# Patient Record
Sex: Female | Born: 1990 | Race: White | Hispanic: No | Marital: Single | Smoking: Never smoker
Health system: Southern US, Community
[De-identification: ages and names within clinical notes are randomized; demographics above are authoritative.]

## PROBLEM LIST (undated history)

## (undated) DIAGNOSIS — N200 Calculus of kidney: Secondary | ICD-10-CM

## (undated) DIAGNOSIS — N83209 Unspecified ovarian cyst, unspecified side: Secondary | ICD-10-CM

---
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
## 2013-11-13 ENCOUNTER — Emergency Department (HOSPITAL_COMMUNITY): Payer: 59

## 2013-11-13 ENCOUNTER — Encounter (HOSPITAL_COMMUNITY): Payer: Self-pay | Admitting: Emergency Medicine

## 2013-11-13 ENCOUNTER — Emergency Department (HOSPITAL_COMMUNITY)
Admission: EM | Admit: 2013-11-13 | Discharge: 2013-11-13 | Disposition: A | Discharge status: Home / Self Care | Payer: 59 | Attending: Emergency Medicine | Admitting: Emergency Medicine

## 2013-11-13 DIAGNOSIS — R109 Unspecified abdominal pain: Secondary | ICD-10-CM | POA: Insufficient documentation

## 2013-11-13 DIAGNOSIS — R000 Tachycardia, unspecified: Secondary | ICD-10-CM | POA: Diagnosis not present

## 2013-11-13 DIAGNOSIS — Z3202 Encounter for pregnancy test, result negative: Secondary | ICD-10-CM | POA: Insufficient documentation

## 2013-11-13 DIAGNOSIS — N200 Calculus of kidney: Secondary | ICD-10-CM | POA: Insufficient documentation

## 2013-11-13 DIAGNOSIS — N838 Other noninflammatory disorders of ovary, fallopian tube and broad ligament: Secondary | ICD-10-CM | POA: Diagnosis not present

## 2013-11-13 DIAGNOSIS — N83209 Unspecified ovarian cyst, unspecified side: Secondary | ICD-10-CM

## 2013-11-13 HISTORY — DX: Calculus of kidney: N20.0

## 2013-11-13 HISTORY — DX: Unspecified ovarian cyst, unspecified side: N83.209

## 2013-11-13 LAB — URINALYSIS, ROUTINE W REFLEX MICROSCOPIC
BILIRUBIN URINE: NEGATIVE
GLUCOSE, UA: NEGATIVE mg/dL
Ketones, ur: NEGATIVE mg/dL
Leukocytes, UA: NEGATIVE
Nitrite: NEGATIVE
PH: 8 (ref 5.0–8.0)
Protein, ur: NEGATIVE mg/dL
SPECIFIC GRAVITY, URINE: 1.021 (ref 1.005–1.030)
UROBILINOGEN UA: 0.2 mg/dL (ref 0.0–1.0)

## 2013-11-13 LAB — I-STAT CHEM 8, ED
BUN: 12 mg/dL (ref 6–23)
CHLORIDE: 104 meq/L (ref 96–112)
CREATININE: 0.7 mg/dL (ref 0.50–1.10)
Calcium, Ion: 1.17 mmol/L (ref 1.12–1.23)
GLUCOSE: 101 mg/dL — AB (ref 70–99)
HCT: 40 % (ref 36.0–46.0)
Hemoglobin: 13.6 g/dL (ref 12.0–15.0)
POTASSIUM: 4.6 meq/L (ref 3.7–5.3)
SODIUM: 138 meq/L (ref 137–147)
TCO2: 25 mmol/L (ref 0–100)

## 2013-11-13 LAB — URINE MICROSCOPIC-ADD ON

## 2013-11-13 LAB — POC URINE PREG, ED: Preg Test, Ur: NEGATIVE

## 2013-11-13 MED ORDER — KETOROLAC TROMETHAMINE 30 MG/ML IJ SOLN
30.0000 mg | Freq: Once | INTRAMUSCULAR | Status: AC
  Administered 2013-11-13: 30 mg via INTRAVENOUS
  Filled 2013-11-13: qty 1

## 2013-11-13 MED ORDER — ONDANSETRON 4 MG PO TBDP
8.0000 mg | ORAL_TABLET | Freq: Once | ORAL | Status: AC
  Administered 2013-11-13: 8 mg via ORAL
  Filled 2013-11-13: qty 2

## 2013-11-13 MED ORDER — MORPHINE SULFATE 4 MG/ML IJ SOLN
4.0000 mg | Freq: Once | INTRAMUSCULAR | Status: AC
  Administered 2013-11-13: 4 mg via INTRAVENOUS
  Filled 2013-11-13: qty 1

## 2013-11-13 MED ORDER — TAMSULOSIN HCL 0.4 MG PO CAPS: 0.4000 mg | ORAL_CAPSULE | Freq: Every day | ORAL | Status: AC

## 2013-11-13 MED ORDER — OXYCODONE-ACETAMINOPHEN 5-325 MG PO TABS
2.0000 | ORAL_TABLET | ORAL | Status: AC | PRN
Start: 2013-11-13 — End: ?

## 2013-11-13 MED ORDER — SODIUM CHLORIDE 0.9 % IV BOLUS (SEPSIS)
1000.0000 mL | Freq: Once | INTRAVENOUS | Status: AC
  Administered 2013-11-13: 1000 mL via INTRAVENOUS

## 2013-11-13 MED ORDER — ONDANSETRON HCL 4 MG/2ML IJ SOLN
4.0000 mg | Freq: Once | INTRAMUSCULAR | Status: AC
  Administered 2013-11-13: 4 mg via INTRAVENOUS
  Filled 2013-11-13: qty 2

## 2013-11-13 MED ORDER — ONDANSETRON HCL 4 MG PO TABS: 4.0000 mg | ORAL_TABLET | Freq: Four times a day (QID) | ORAL | Status: AC

## 2013-11-13 MED ORDER — OXYCODONE-ACETAMINOPHEN 5-325 MG PO TABS
1.0000 | ORAL_TABLET | Freq: Once | ORAL | Status: AC
  Administered 2013-11-13: 1 via ORAL
  Filled 2013-11-13: qty 1

## 2013-11-13 NOTE — ED Notes (Addendum)
Pt with emesis x 1.  PA stated to give zofran and fluid challenge and watch x 30 min.

## 2013-11-13 NOTE — ED Provider Notes (Signed)
CSN: [id number]     Arrival date & time 11/13/13  0911 History   First MD Initiated Contact with Patient 11/13/13 0945     Chief Complaint  Patient presents with  . Flank Pain     (Consider location/radiation/quality/duration/timing/severity/associated sxs/prior Treatment) HPI  23 year old female with history of ovarian cyst and history of kidney stone presents complaining of left flank pain. Patient reports gradual onset of pain to her left flank that started this morning. Pain is described as a sharp sensation, intense, and colicky. Pain is nonradiating and felt similar to kidney stones in the past. She felt nauseous but without any vomiting or diarrhea. Pain eventually subsided after taking several Advil. Currently her pain is 6/10. She reported mild urinary discomfort. There is no associated fever, chills, headache, chest pain, shortness of breath, abdominal pain, hematuria, vaginal discharge, hematochezia or melena. She was last diagnosed with kidney stones 2 years ago. She reports that the previous CT scan did demonstrate several nonobstructive kidney stones in both kidneys according to her doctor.    Past Medical History  Diagnosis Date  . Kidney stones   . Ovarian cyst    History reviewed. No pertinent past surgical history. No family history on file. History  Substance Use Topics  . Smoking status: Never Smoker   . Smokeless tobacco: Not on file  . Alcohol Use: No   OB History   Grav Para Term Preterm Abortions TAB SAB Ect Mult Living                 Review of Systems  All other systems reviewed and are negative.     Allergies  Review of patient's allergies indicates no known allergies.  Home Medications   Prior to Admission medications   Medication Sig Start Date End Date Taking? Authorizing Provider  ibuprofen (ADVIL,MOTRIN) 600 MG tablet Take 1 tablet (600 mg total) by mouth every 6 (six) hours as needed for pain. 12/31/11   Nelia Shi, MD   oxyCODONE-acetaminophen (PERCOCET) 7.5-325 MG per tablet Take 1 tablet by mouth every 4 (four) hours as needed for pain. 12/31/11   Nelia Shi, MD  promethazine (PHENERGAN) 25 MG tablet Take 1 tablet (25 mg total) by mouth every 6 (six) hours as needed for nausea. 12/31/11   Nelia Shi, MD   BP 141/86  Pulse 115  Temp(Src) 97.8 F (36.6 C) (Oral)  Resp 20  Ht  (1.651 m)  Wt 125 lb (56.7 kg)  BMI 20.80 kg/m2  SpO2 100%  LMP 10/13/2013 Physical Exam  Nursing note and vitals reviewed. Constitutional: She appears well-developed and well-nourished. No distress.  HENT:  Head: Atraumatic.  Eyes: Conjunctivae are normal.  Neck: Neck supple.  Cardiovascular:  Tachycardia without murmur rubs or gallops  Pulmonary/Chest: Effort normal and breath sounds normal.  Abdominal: Soft. She exhibits no distension. There is no tenderness.  Genitourinary:  Left CVA tenderness on percussion  Neurological: She is alert.  Skin: No rash noted.  Psychiatric: She has a normal mood and affect.    ED Course  Procedures (including critical care time)  9:58 AM Patient presents with left flank pain suggestive of kidney stones. She also has history of ovarian cyst in the past however her abdominal exam is unremarkable at this time. Workup initiated. Patient is tachycardic, likely secondary to pain. Pain medication given  1:12 PM UA without evidence of urinary tract infection, renal function is normal, pregnancy test is negative, abdominal and pelvis  CT scan demonstrated a 3 mm callus to the distal left ureter causing mild to moderate hydronephrosis. The intrarenal calculi bilaterally. There are also fluid tracking from the left ovary to the cul-de-sac probably is indicated of recent ovarian cyst rupture. At this time patient's pain has improved from 10 out of 10-7/10. We'll continue with pain management. Patient is able to tolerates by mouth. Plan to have patient followup with urologist for  further evaluation. Patient will be discharged with urine strainer, pain medication, and Flomax. Patient voiced understanding and agrees with plan.   Labs Review Labs Reviewed  URINALYSIS, ROUTINE W REFLEX MICROSCOPIC - Abnormal; Notable for the following:    APPearance CLOUDY (*)    Hgb urine dipstick TRACE (*)    All other components within normal limits  I-STAT CHEM 8, ED - Abnormal; Notable for the following:    Glucose, Bld 101 (*)    All other components within normal limits  URINE MICROSCOPIC-ADD ON  POC URINE PREG, ED    Imaging Review Ct Renal Stone Study  11/13/2013   CLINICAL DATA:  Flank pain  EXAM: CT ABDOMEN AND PELVIS WITHOUT CONTRAST  TECHNIQUE: Multidetector CT imaging of the abdomen and pelvis was performed following the standard protocol without oral or intravenous contrast material administration.  COMPARISON:  December 31, 2011  FINDINGS: Lung bases are clear.  The liver is prominent measuring 16.9 cm in length. No focal liver lesions are identified on this noncontrast enhanced study. Gallbladder wall is not thickened. There is no appreciable biliary duct dilatation.  Spleen, pancreas, and adrenals appear normal.  In the right kidney, there is a 3 mm calculus in the mid kidney region laterally. There is a 2 mm calculus in the mid kidney anteriorly with a 1 mm calculus slightly more posteriorly in the mid kidney. There are several tiny calculi in the upper pole region. There is no right renal mass or hydronephrosis. There is no right ureteral calculus apparent.  In the left kidney, there is a 3 mm calculus anteriorly in the midportion. More posteriorly on the left, there is a tiny calculus in the mid kidney. There is mild to moderate hydronephrosis on the left. There is no renal mass on the left. On axial slice 73, there is a 3 mm calculus in the distal left ureter. No other ureteral calculi are identified.  In the pelvis, the urinary bladder is midline with normal wall  thickness. There is fluid tracking from the left ovary to the cul-de-sac. There is no pelvic mass appreciable. There is a follicle in the right ovary measuring 2.2 x 2.0 cm.  Appendix appears normal.  There is no bowel obstruction.  No free air or portal venous air.  There is no adenopathy or abscess in the abdomen or pelvis. There is no demonstrable abdominal aortic aneurysm. There are no blastic or lytic bone lesions.  IMPRESSION: There is a 3 mm calculus in the distal left ureter causing mild to moderate hydronephrosis on the left. There are intrarenal calculi bilaterally.  Fluid tracking from the left ovary to the cul-de-sac probably is indicative of recent ovarian cyst rupture.  No abscess. No bowel obstruction. The appendix region appears normal.  Liver is prominent but otherwise unremarkable in appearance.   Electronically Signed   By: Bretta Bang M.D.   On: 11/13/2013 12:45     EKG Interpretation None      MDM   Final diagnoses:  Kidney stone on left side  Ruptured ovarian cyst  BP 132/79  Pulse 72  Temp(Src) 97.8 F (36.6 C) (Oral)  Resp 21  Ht  (1.651 m)  Wt 125 lb (56.7 kg)  BMI 20.80 kg/m2  SpO2 100%  LMP 10/13/2013  I have reviewed nursing notes and vital signs. I personally reviewed the imaging tests through PACS system  I reviewed available ER/hospitalization records thought the EMR     Fayrene Helper, [place] 11/13/13 1352

## 2013-11-13 NOTE — ED Notes (Signed)
Pt c/o left flank pain onset this morning pt has history of kidney stones and ruptured ovarian cyst in the past. Pt reports nausea.

## 2013-11-13 NOTE — ED Notes (Signed)
PA notified of pt pain.  PA at bedside updating pt.

## 2013-11-13 NOTE — ED Notes (Signed)
PT tolerated po without emesis

## 2013-11-13 NOTE — ED Provider Notes (Signed)
Medical screening examination/treatment/procedure(s) were performed by non-physician practitioner and as supervising physician I was immediately available for consultation/collaboration.   EKG Interpretation None        Jisell Majer J. Phylis Javed, MD 11/13/13 1353 

## 2013-11-13 NOTE — Discharge Instructions (Signed)
You have a 3mm stone on the left kidney at the distal ureter.  You will likely pass this stone within 3 days.  Use urine strainer to capture the stone and bring it to the urologist for further evaluation.  Take pain medication as needed.  Return if your symptoms worsen or if you have other concerns.  Kidney Stones Kidney stones (urolithiasis) are solid masses that form inside your kidneys. The intense pain is caused by the stone moving through the kidney, ureter, bladder, and urethra (urinary tract). When the stone moves, the ureter starts to spasm around the stone. The stone is usually passed in your pee (urine).  HOME CARE  Drink enough fluids to keep your pee clear or pale yellow. This helps to get the stone out.  Strain all pee through the provided strainer. Do not pee without peeing through the strainer, not even once. If you pee the stone out, catch it in the strainer. The stone may be as small as a grain of salt. Take this to your doctor. This will help your doctor figure out what you can do to try to prevent more kidney stones.  Only take medicine as told by your doctor.  Follow up with your doctor as told.  Get follow-up X-rays as told by your doctor. GET HELP IF: You have pain that gets worse even if you have been taking pain medicine. GET HELP RIGHT AWAY IF:   Your pain does not get better with medicine.  You have a fever or shaking chills.  Your pain increases and gets worse over 18 hours.  You have new belly (abdominal) pain.  You feel faint or pass out.  You are unable to pee. MAKE SURE YOU:   Understand these instructions.  Will watch your condition.  Will get help right away if you are not doing well or get worse. Document Released: 08/01/2007 Document Revised: 10/15/2012 Document Reviewed: 07/16/2012 [facility] Patient Information 2015 [place], [place]. This information is not intended to replace advice given to you by your health care provider. Make sure you  discuss any questions you have with your health care provider.

## 2016-05-19 IMAGING — CT CT RENAL STONE PROTOCOL
2 of 4 series · 16 of 46 positions shown, 18 images · non-contrast
Comparison: December 31, 2011

CLINICAL DATA: Flank pain

EXAM:
CT ABDOMEN AND PELVIS WITHOUT CONTRAST
TECHNIQUE: Multidetector CT imaging of the abdomen and pelvis was performed
following the standard protocol without oral or intravenous contrast
material administration.

[Series 2: stone study 5.0 i30f 1 · axial · 0.65mm/px · z∈[-471,-66]mm · 13 of 89 slices shown, 15 images]
[im 4/89  soft-tissue]
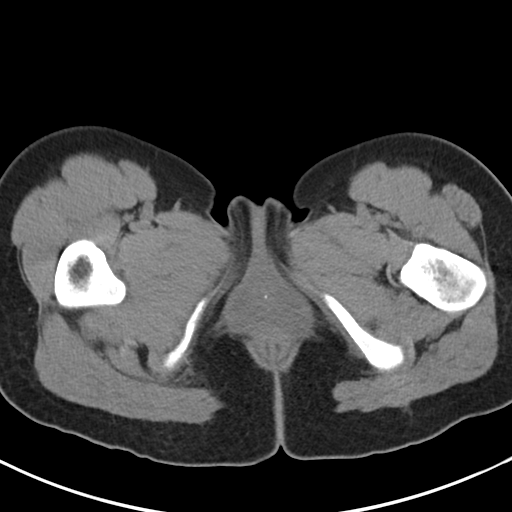
[im 4/89  bone]
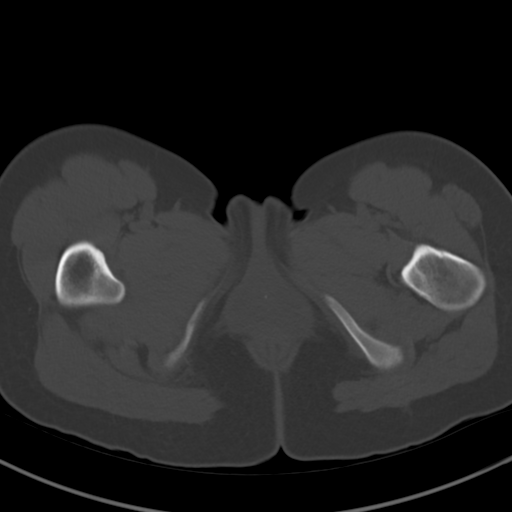
[im 11/89  soft-tissue]
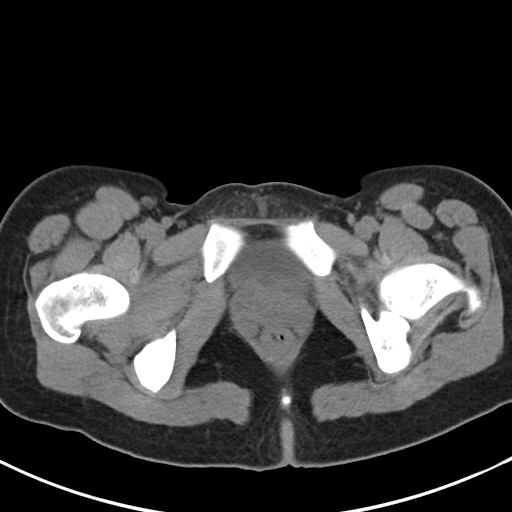
[im 18/89  soft-tissue]
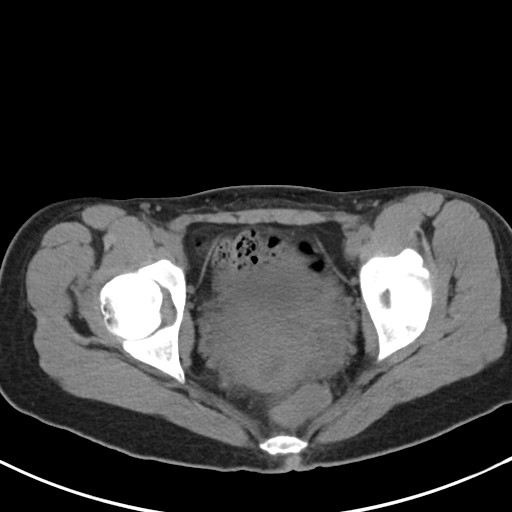
[im 25/89  soft-tissue]
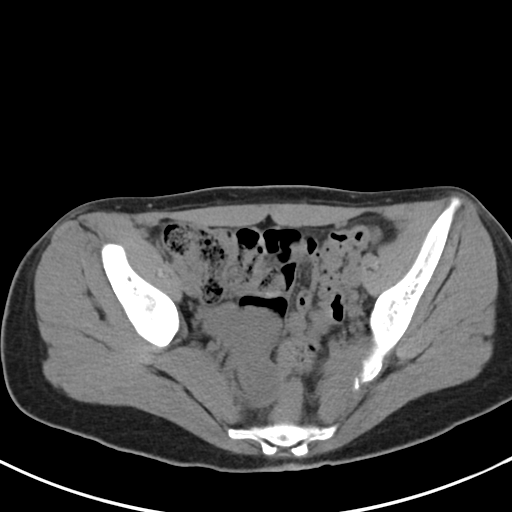
[im 32/89  soft-tissue]
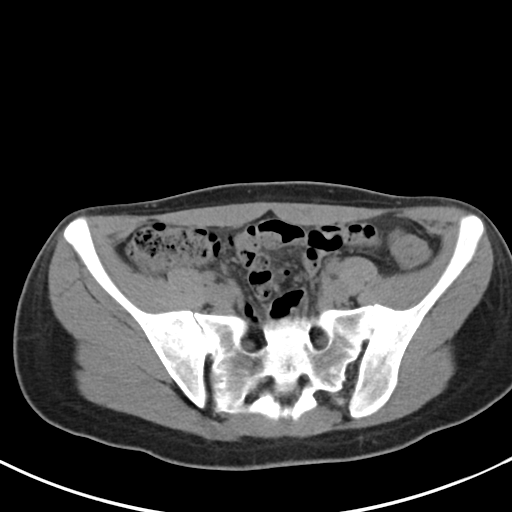
[im 39/89  soft-tissue]
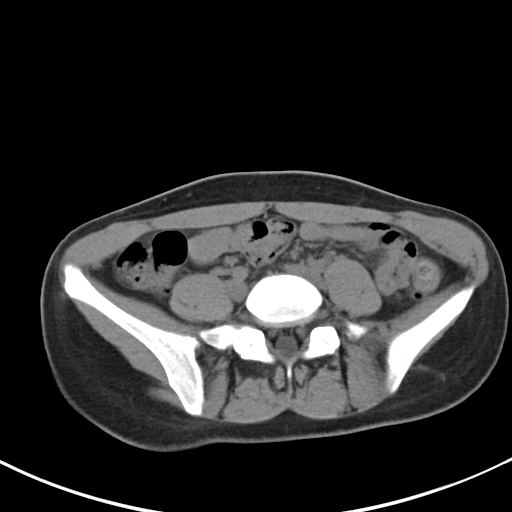
[im 46/89  soft-tissue]
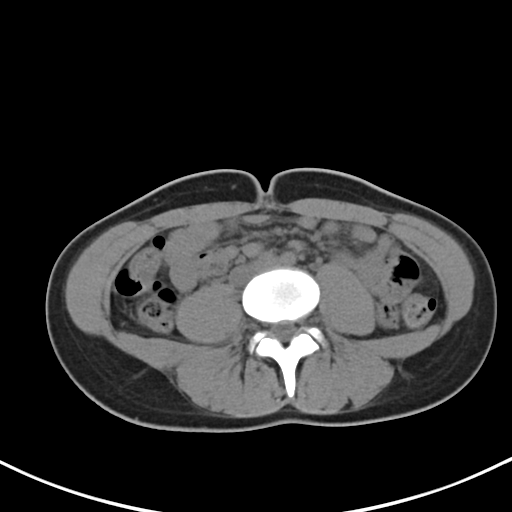
[im 50/89  soft-tissue]
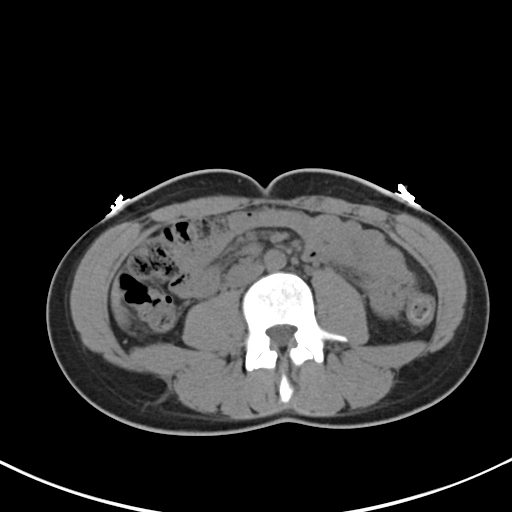
[im 57/89  soft-tissue]
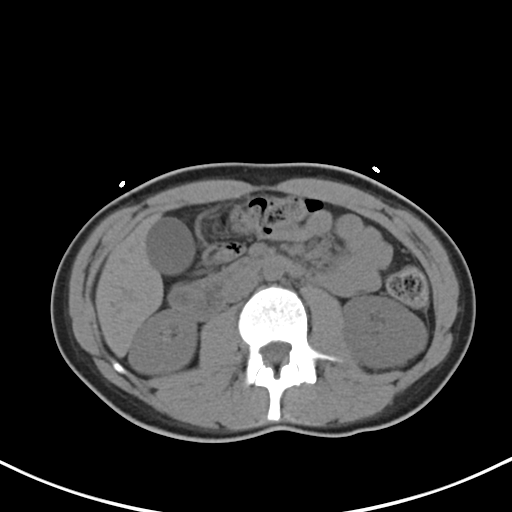
[im 57/89  bone]
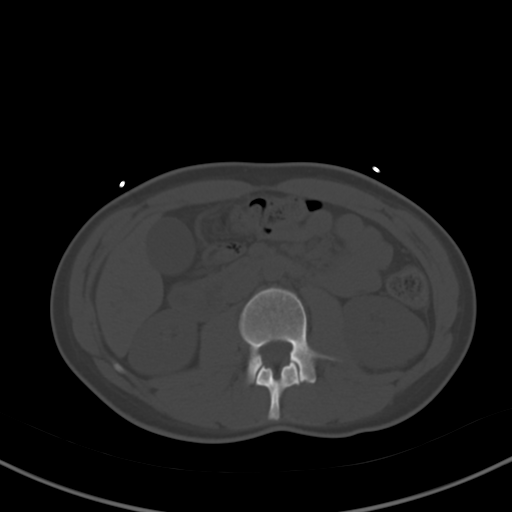
[im 64/89  soft-tissue]
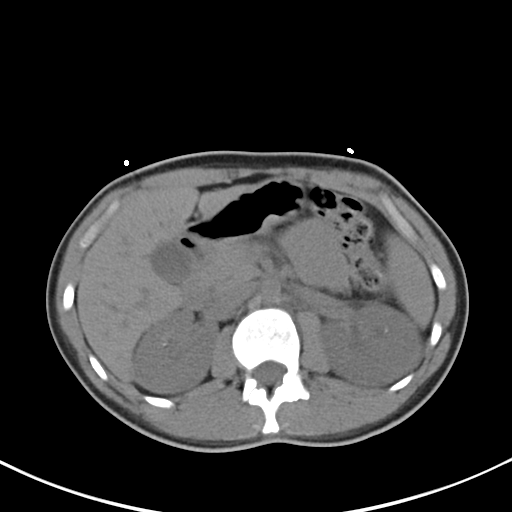
[im 71/89  soft-tissue]
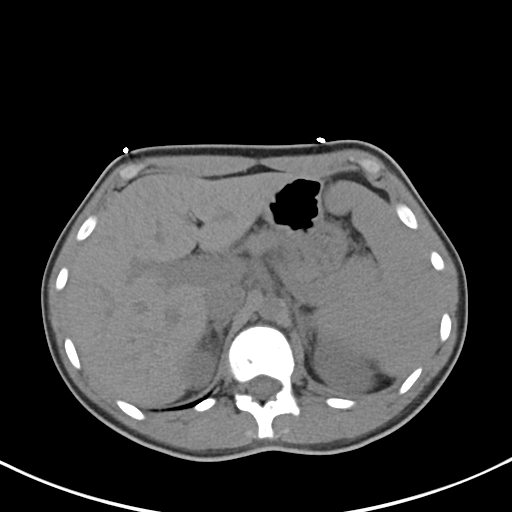
[im 78/89  soft-tissue]
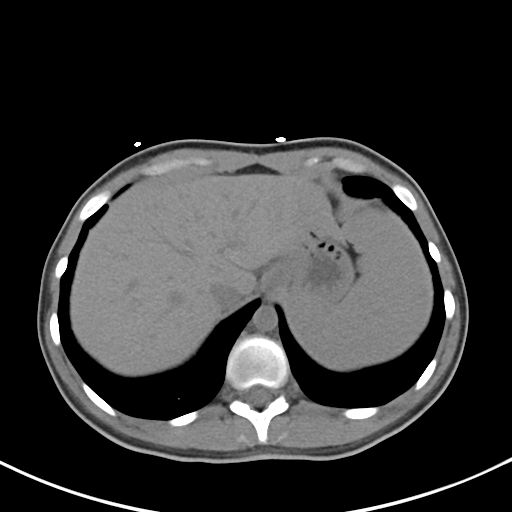
[im 85/89  soft-tissue]
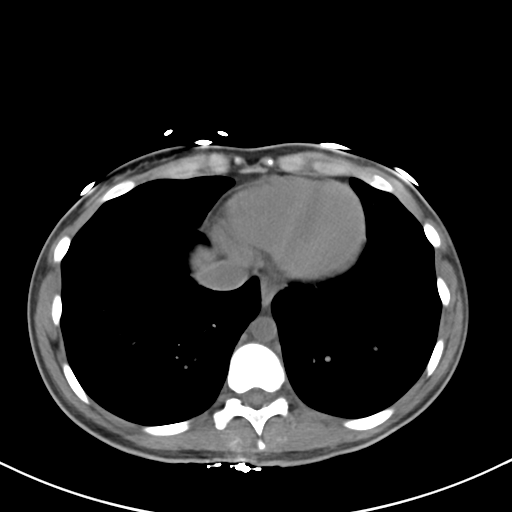

[Series 5: coronal soft tissue · coronal · 0.74mm/px · 3 of 66 slices shown]
[im 22/66  soft-tissue]
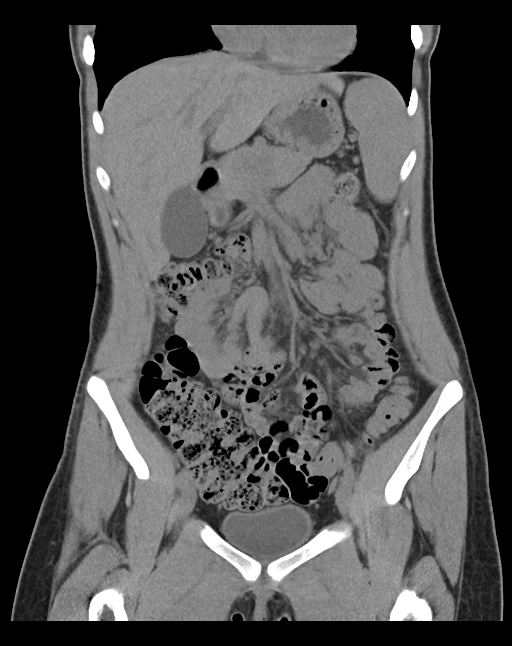
[im 29/66  soft-tissue]
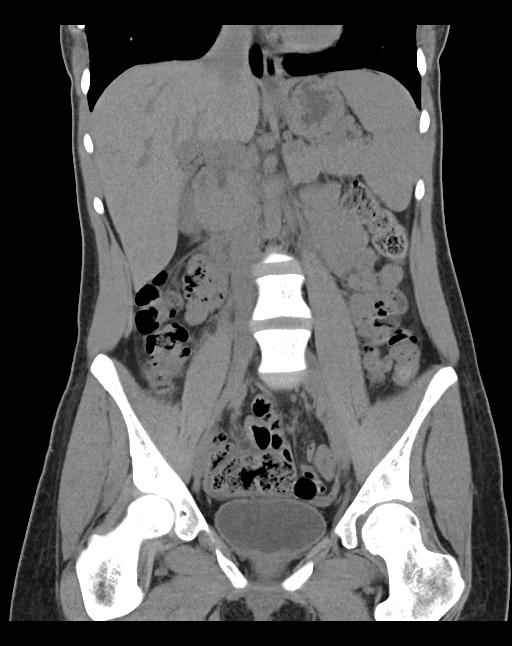
[im 37/66  soft-tissue]
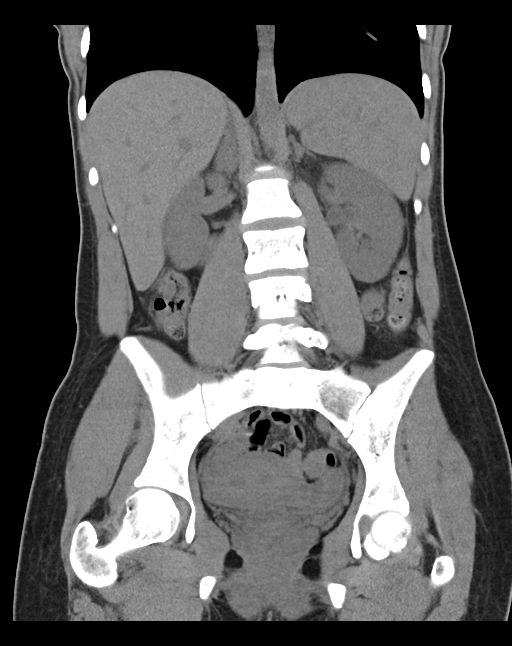

[16 of 46 positions shown; findings below may reference images not displayed]

FINDINGS: Lung bases are clear.

The liver is prominent measuring 16.9 cm in length. No focal liver
lesions are identified on this noncontrast enhanced study.
Gallbladder wall is not thickened. There is no appreciable biliary
duct dilatation.

Spleen, pancreas, and adrenals appear normal.

In the right kidney, there is a 3 mm calculus in the mid kidney
region laterally. There is a 2 mm calculus in the mid kidney
anteriorly with a 1 mm calculus slightly more posteriorly in the mid
kidney. There are several tiny calculi in the upper pole region.
There is no right renal mass or hydronephrosis. There is no right
ureteral calculus apparent.

In the left kidney, there is a 3 mm calculus anteriorly in the
midportion. More posteriorly on the left, there is a tiny calculus
in the mid kidney. There is mild to moderate hydronephrosis on the
left. There is no renal mass on the left. On axial slice 73, there
is a 3 mm calculus in the distal left ureter. No other ureteral
calculi are identified.

In the pelvis, the urinary bladder is midline with normal wall
thickness. There is fluid tracking from the left ovary to the
cul-de-sac. There is no pelvic mass appreciable. There is a follicle
in the right ovary measuring 2.2 x 2.0 cm.

Appendix appears normal.

There is no bowel obstruction.  No free air or portal venous air.

There is no adenopathy or abscess in the abdomen or pelvis. There is
no demonstrable abdominal aortic aneurysm. There are no blastic or
lytic bone lesions.
IMPRESSION: There is a 3 mm calculus in the distal left ureter causing mild to
moderate hydronephrosis on the left. There are intrarenal calculi
bilaterally.

Fluid tracking from the left ovary to the cul-de-sac probably is
indicative of recent ovarian cyst rupture.

No abscess. No bowel obstruction. The appendix region appears
normal.

Liver is prominent but otherwise unremarkable in appearance.
